# Patient Record
Sex: Female | Born: 1995 | Hispanic: Yes | Marital: Single | Smoking: Never smoker
Health system: Southern US, Community
[De-identification: ages and names within clinical notes are randomized; demographics above are authoritative.]

## PROBLEM LIST (undated history)

## (undated) DIAGNOSIS — F329 Major depressive disorder, single episode, unspecified: Secondary | ICD-10-CM

## (undated) DIAGNOSIS — F32A Depression, unspecified: Secondary | ICD-10-CM

## (undated) DIAGNOSIS — G8929 Other chronic pain: Secondary | ICD-10-CM

## (undated) DIAGNOSIS — N12 Tubulo-interstitial nephritis, not specified as acute or chronic: Secondary | ICD-10-CM

## (undated) DIAGNOSIS — N390 Urinary tract infection, site not specified: Secondary | ICD-10-CM

## (undated) DIAGNOSIS — M545 Low back pain, unspecified: Secondary | ICD-10-CM

## (undated) DIAGNOSIS — R51 Headache: Secondary | ICD-10-CM

## (undated) DIAGNOSIS — R519 Headache, unspecified: Secondary | ICD-10-CM

## (undated) HISTORY — PX: NO PAST SURGERIES: SHX2092

---

## 2016-06-19 ENCOUNTER — Emergency Department (HOSPITAL_COMMUNITY): Payer: Self-pay

## 2016-06-19 ENCOUNTER — Inpatient Hospital Stay (HOSPITAL_COMMUNITY)
Admission: EM | Admit: 2016-06-19 | Discharge: 2016-06-22 | DRG: 690 | Disposition: A | Discharge status: Home / Self Care | Payer: Self-pay | Attending: Family Medicine | Admitting: Family Medicine

## 2016-06-19 ENCOUNTER — Encounter (HOSPITAL_COMMUNITY): Payer: Self-pay | Admitting: Emergency Medicine

## 2016-06-19 DIAGNOSIS — N10 Acute tubulo-interstitial nephritis: Principal | ICD-10-CM | POA: Diagnosis present

## 2016-06-19 DIAGNOSIS — N12 Tubulo-interstitial nephritis, not specified as acute or chronic: Secondary | ICD-10-CM

## 2016-06-19 DIAGNOSIS — R059 Cough, unspecified: Secondary | ICD-10-CM

## 2016-06-19 DIAGNOSIS — B373 Candidiasis of vulva and vagina: Secondary | ICD-10-CM | POA: Diagnosis present

## 2016-06-19 DIAGNOSIS — E876 Hypokalemia: Secondary | ICD-10-CM

## 2016-06-19 DIAGNOSIS — R000 Tachycardia, unspecified: Secondary | ICD-10-CM | POA: Diagnosis present

## 2016-06-19 DIAGNOSIS — B3731 Acute candidiasis of vulva and vagina: Secondary | ICD-10-CM

## 2016-06-19 DIAGNOSIS — R05 Cough: Secondary | ICD-10-CM

## 2016-06-19 DIAGNOSIS — R509 Fever, unspecified: Secondary | ICD-10-CM

## 2016-06-19 DIAGNOSIS — Z87440 Personal history of urinary (tract) infections: Secondary | ICD-10-CM

## 2016-06-19 HISTORY — DX: Other chronic pain: G89.29

## 2016-06-19 HISTORY — DX: Headache: R51

## 2016-06-19 HISTORY — DX: Headache, unspecified: R51.9

## 2016-06-19 HISTORY — DX: Urinary tract infection, site not specified: N39.0

## 2016-06-19 HISTORY — DX: Tubulo-interstitial nephritis, not specified as acute or chronic: N12

## 2016-06-19 HISTORY — DX: Low back pain: M54.5

## 2016-06-19 HISTORY — DX: Low back pain, unspecified: M54.50

## 2016-06-19 HISTORY — DX: Depression, unspecified: F32.A

## 2016-06-19 HISTORY — DX: Major depressive disorder, single episode, unspecified: F32.9

## 2016-06-19 LAB — LACTIC ACID, PLASMA
LACTIC ACID, VENOUS: 0.6 mmol/L (ref 0.5–1.9)
LACTIC ACID, VENOUS: 1.4 mmol/L (ref 0.5–1.9)

## 2016-06-19 LAB — URINALYSIS, ROUTINE W REFLEX MICROSCOPIC
BILIRUBIN URINE: NEGATIVE
Glucose, UA: NEGATIVE mg/dL
KETONES UR: 80 mg/dL — AB
Nitrite: POSITIVE — AB
PROTEIN: 30 mg/dL — AB
Specific Gravity, Urine: 1.02 (ref 1.005–1.030)
pH: 7 (ref 5.0–8.0)

## 2016-06-19 LAB — COMPREHENSIVE METABOLIC PANEL
ALBUMIN: 3.5 g/dL (ref 3.5–5.0)
ALK PHOS: 108 U/L (ref 38–126)
ALT: 27 U/L (ref 14–54)
ANION GAP: 10 (ref 5–15)
AST: 29 U/L (ref 15–41)
BUN: 5 mg/dL — ABNORMAL LOW (ref 6–20)
CO2: 22 mmol/L (ref 22–32)
Calcium: 9.2 mg/dL (ref 8.9–10.3)
Chloride: 103 mmol/L (ref 101–111)
Creatinine, Ser: 0.76 mg/dL (ref 0.44–1.00)
GFR calc non Af Amer: >60 mL/min (ref >60)
GLUCOSE: 107 mg/dL — AB (ref 65–99)
Potassium: 3.3 mmol/L — ABNORMAL LOW (ref 3.5–5.1)
SODIUM: 135 mmol/L (ref 135–145)
Total Bilirubin: 1.4 mg/dL — ABNORMAL HIGH (ref 0.3–1.2)
Total Protein: 7.5 g/dL (ref 6.5–8.1)

## 2016-06-19 LAB — CBC WITH DIFFERENTIAL/PLATELET
BASOS PCT: 0 %
Basophils Absolute: 0 10*3/uL (ref 0.0–0.1)
EOS ABS: 0 10*3/uL (ref 0.0–0.7)
EOS PCT: 0 %
HCT: 41.5 % (ref 36.0–46.0)
HEMOGLOBIN: 13.8 g/dL (ref 12.0–15.0)
LYMPHS ABS: 1.6 10*3/uL (ref 0.7–4.0)
Lymphocytes Relative: 8 %
MCH: 30.3 pg (ref 26.0–34.0)
MCHC: 33.3 g/dL (ref 30.0–36.0)
MCV: 91 fL (ref 78.0–100.0)
Monocytes Absolute: 1.5 10*3/uL — ABNORMAL HIGH (ref 0.1–1.0)
Monocytes Relative: 8 %
NEUTROS PCT: 84 %
Neutro Abs: 16 10*3/uL — ABNORMAL HIGH (ref 1.7–7.7)
PLATELETS: 274 10*3/uL (ref 150–400)
RBC: 4.56 MIL/uL (ref 3.87–5.11)
RDW: 13 % (ref 11.5–15.5)
WBC: 19.1 10*3/uL — AB (ref 4.0–10.5)

## 2016-06-19 LAB — PREGNANCY, URINE: Preg Test, Ur: NEGATIVE

## 2016-06-19 LAB — LIPASE, BLOOD: Lipase: 21 U/L (ref 11–51)

## 2016-06-19 LAB — WET PREP, GENITAL
CLUE CELLS WET PREP: NONE SEEN
SPERM: NONE SEEN
TRICH WET PREP: NONE SEEN

## 2016-06-19 MED ORDER — MORPHINE SULFATE (PF) 4 MG/ML IV SOLN
4.0000 mg | Freq: Once | INTRAVENOUS | Status: AC
  Administered 2016-06-19: 4 mg via INTRAVENOUS
  Filled 2016-06-19: qty 1

## 2016-06-19 MED ORDER — CEFTRIAXONE SODIUM 1 G IJ SOLR
1.0000 g | Freq: Once | INTRAMUSCULAR | Status: AC
  Administered 2016-06-19: 1 g via INTRAVENOUS
  Filled 2016-06-19: qty 10

## 2016-06-19 MED ORDER — FLUCONAZOLE 100 MG PO TABS
150.0000 mg | ORAL_TABLET | Freq: Once | ORAL | Status: AC
  Administered 2016-06-19: 150 mg via ORAL
  Filled 2016-06-19: qty 2

## 2016-06-19 MED ORDER — SODIUM CHLORIDE 0.9 % IV BOLUS (SEPSIS)
1000.0000 mL | Freq: Once | INTRAVENOUS | Status: AC
  Administered 2016-06-19: 1000 mL via INTRAVENOUS

## 2016-06-19 MED ORDER — ACETAMINOPHEN 325 MG PO TABS
650.0000 mg | ORAL_TABLET | Freq: Four times a day (QID) | ORAL | Status: DC | PRN
  Administered 2016-06-19 – 2016-06-22 (×4): 650 mg via ORAL
  Filled 2016-06-19 (×4): qty 2

## 2016-06-19 MED ORDER — ACETAMINOPHEN 325 MG PO TABS
650.0000 mg | ORAL_TABLET | Freq: Once | ORAL | Status: AC
  Administered 2016-06-19: 650 mg via ORAL
  Filled 2016-06-19: qty 2

## 2016-06-19 MED ORDER — SODIUM CHLORIDE 0.9 % IV SOLN
INTRAVENOUS | Status: DC
  Administered 2016-06-19 – 2016-06-22 (×7): via INTRAVENOUS

## 2016-06-19 MED ORDER — ONDANSETRON HCL 4 MG PO TABS
4.0000 mg | ORAL_TABLET | Freq: Four times a day (QID) | ORAL | Status: DC | PRN
Start: 2016-06-19 — End: 2016-06-22
  Administered 2016-06-20: 4 mg via ORAL
  Filled 2016-06-19: qty 1

## 2016-06-19 MED ORDER — HYDROMORPHONE HCL 2 MG/ML IJ SOLN
1.0000 mg | INTRAMUSCULAR | Status: DC | PRN
  Administered 2016-06-19 – 2016-06-20 (×2): 1 mg via INTRAVENOUS
  Filled 2016-06-19 (×2): qty 1

## 2016-06-19 MED ORDER — DEXTROSE 5 % IV SOLN
1.0000 g | INTRAVENOUS | Status: DC
  Administered 2016-06-20: 1 g via INTRAVENOUS
  Filled 2016-06-19: qty 10

## 2016-06-19 MED ORDER — HYDROMORPHONE HCL 2 MG/ML IJ SOLN
1.0000 mg | Freq: Once | INTRAMUSCULAR | Status: AC
  Administered 2016-06-19: 1 mg via INTRAVENOUS
  Filled 2016-06-19: qty 1

## 2016-06-19 MED ORDER — ONDANSETRON HCL 4 MG/2ML IJ SOLN
4.0000 mg | Freq: Once | INTRAMUSCULAR | Status: AC
  Administered 2016-06-19: 4 mg via INTRAVENOUS
  Filled 2016-06-19: qty 2

## 2016-06-19 MED ORDER — ONDANSETRON HCL 4 MG/2ML IJ SOLN
4.0000 mg | Freq: Four times a day (QID) | INTRAMUSCULAR | Status: DC | PRN
  Administered 2016-06-19: 4 mg via INTRAVENOUS
  Filled 2016-06-19: qty 2

## 2016-06-19 MED ORDER — INFLUENZA VAC SPLIT QUAD 0.5 ML IM SUSY
0.5000 mL | PREFILLED_SYRINGE | INTRAMUSCULAR | Status: AC
  Administered 2016-06-20: 0.5 mL via INTRAMUSCULAR
  Filled 2016-06-19: qty 0.5

## 2016-06-19 MED ORDER — IBUPROFEN 800 MG PO TABS
800.0000 mg | ORAL_TABLET | Freq: Once | ORAL | Status: AC
  Administered 2016-06-19: 800 mg via ORAL
  Filled 2016-06-19: qty 1

## 2016-06-19 NOTE — Progress Notes (Signed)
I saw and examined this patient.  Discussed with residents.  I will co sign H&PE when available.  21 yo hispanic female with fever and right sided flank and abd pain.  Labs thus far are consistent with pyelo, the most likely Dx.  PID is also in the differential.  She has lowish BP, tachycardia and high WBC so we are also evaluating for sepsis.  By my assessment she is cardiovascularly stable at present.

## 2016-06-19 NOTE — ED Notes (Signed)
Placed pelvic cart at beside

## 2016-06-19 NOTE — Progress Notes (Signed)
[facility] Admission History and Physical Service Pager: [phone]  Patient name: Laura Ball Medical record number: [id number] Date of birth: 03-24-1996 Age: 21 y.o. Gender: female  Primary Care Provider: No PCP Per Patient Consultants: None Code Status: Full   Chief Complaint: Abdominal and back pain   [employer] interpretor with ID # [id number] was used for this encounter Assessment and Plan: Laura Ball is a 21 y.o. female with no significant past medical history who presents with bilaterally back pain and diffuse abdominal pain concerning for pyelonephritis.  #Pyelonephritis, acute in the setting of recurrent UTI Patient presents with dysuria for the past five days with CVA tenderness and abdominal pain. On admission, patient was febrile to 100.5, tachycardic and had a leukocytosis to19.1 UA was positive with nitrite and rare bacteria. BP was normal on admission 116/62 gradually dropped to 92/56. Although, patient met sepsis criteria (3/4 on SIRS with pyelo as likely source and qSOFA of 1), she was clinically stable to need high level of care. Patient symptoms and physical exam are consistent with pyelonephritis in the setting of a UTI. Pelvic exam in the ED (by ED provider) without adnexal or cervical motion tenderness which makes PID unlikely but can't rule out this given recent unprotected sex. Patient could also have torsion but very unlikely given lab finding and physical exam. Diffuse/suprapubic pain most likely secondary to retroperitoneal pain from pyelo. RUQ [place] was not concerning for cholelithiasis or liver pathology. CMP normal as well. Low suspicion with acute abdomen without rebound tenderness. Lipase normal to think of pancreatitis.  --Admit to FMTS, admitting physician Dr. Andria Frames --Continue Ceftriaxone 1g.  s/p one dose in the ED. --1L bolus NS. S/p 2L in ED --Start patient on NS 125 cc/hr --Follow up on urine cultures  --Follow up on blood  cultures-obtained about 6 hrs after CTX.  --Follow up on lactic acid --Dilaudid 1 mg q3 prn --Tylenol 650 mg q6 prn --Zofran 4 mg q6 prn --Monitor vitals signs --Follow up on GC/Ch, HIV --Follow up on am CBC and BMP. 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  #Vaginal candidiasis Patient with month long vaginal itchiness. Wet prep showed yeast infection consistent  with symptoms. --Will start patient on Fluconazole 150 mg once  --Will continue to monitor  #Hypokalemia, mild Most likely secondary to emesis this morning --will Replete as needed  --Follow up on am BMP  FEN/GI:  --Regular diet, NS 125 cc/hr  Prophylaxis:  --SCDs, patient is ambulating  Disposition: Home pending clinical improvement  History of Present Illness:  Laura Ball is a 21 y.o. female with no significant past medical history who presents today with back and abdominal pain, headache and fever. Patient reports that symptoms started about 5 days ago when she started to have burning with urination. Patient also noticed vaginal discharge for the past 5 days. Patient endorses suprapubic pain radiating bilaterally to her back for the past 3 days. Patient endorses fever for the past 24 hr accompanied with some nausea and vomiting this morning. Patient also complain of headaches. Patient has been also experiencing vaginal itchiness for the past month. She denies taking any medications for pain, fever, dysuria and itchiness. Patient denies shortness of breath, chest pain and diarrhea.  Of note patient reports having three similar episodes in the past year while she was living in [place]. Patient is sexually active with her boyfriend and uses condom intermittently.  In the ED, patient  was tachycardic and febrile with a BP of 92/58. Patient had a leukocytosis to 19 and K was 3.3. UA shows positive nitrite and rare bacteria with some ketones and mod Hbg. Pelvic exam  (by ED provider) was consistent with vaginal discharge without any evidence of adnexal tenderness. RUQ [place] was normal. Patient received, Morphine x1 Dilaudid x2, and was started on Ceftriaxone. Patient had 2L NS bolus. Family medicine was called to admit for pain control and continued IV antibiotics in the setting of pyelonephritis.    Review Of Systems: All other systems negative except per HPI  ROS  Patient Active Problem List   Diagnosis Date Noted  . Pyelonephritis 06/19/2016    Past Medical History: History reviewed. No pertinent past medical history.  Past Surgical History: History reviewed. No pertinent surgical history.  Social History: Social History  Substance Use Topics  . Smoking status: Never Smoker  . Smokeless tobacco: Not on file  . Alcohol use No   Additional social history:  Please also refer to relevant sections of EMR.  Family History: No family history on file. (If not completed, MUST add something in)  Allergies and Medications: No Known Allergies No current facility-administered medications on file prior to encounter.    No current outpatient prescriptions on file prior to encounter.    Objective: BP 92/58   Pulse (!) 130   Temp 100.5 F (38.1 C) (Oral)   Resp 20   Wt 145 lb (65.8 kg)   LMP  (LMP Unknown)   SpO2 92%   Exam: General: Patient appears uncomfortable but in NAD, able to participate in exam. HEENT: Normocephalic atraumatic,PEERLA, moist mucus membrane. Cardiac: RRR, normal heart sounds, no murmurs. 2+ radial and PT pulses bilaterally Respiratory: CTAB, normal effort, No wheezes, rales or rhonchi, CVA tenderness bilaterally Abdomen: soft,  Diffusely tender, no murphy sign,  nondistended, no hepatic or splenomegaly, +BS, no guarding or rebound Extremities: no edema or cyanosis. WWP. Skin: warm and dry, no rashes noted Neuro: alert and oriented x4, no focal deficits. Psych: Normal affect and mood  Labs and Imaging: CBC BMET    Recent Labs Lab 06/19/16 0828  WBC 19.1*  HGB 13.8  HCT 41.5  PLT 274    Recent Labs Lab 06/19/16 0828  NA 135  K 3.3*  CL 103  CO2 22  BUN 5*  CREATININE 0.76  GLUCOSE 107*  CALCIUM 9.2    Wet prep: yeast, WBCs, no trich, no clue cells  US Abdomen Limited  Result Date: 06/19/2016 CLINICAL DATA:  Right upper quadrant pain EXAM: US ABDOMEN LIMITED - RIGHT UPPER QUADRANT COMPARISON:  None. FINDINGS: Gallbladder: No gallstones or wall thickening visualized. No sonographic Murphy sign noted by sonographer. Common bile duct: Diameter: 2.6 Liver: No focal lesion identified. Within normal limits in parenchymal echogenicity. IMPRESSION: 1. Normal exam. Electronically Signed   By: Kerby Moors M.D.   On: 06/19/2016 09:25    Marjie Skiff, MD 06/19/2016, 3:16 PM PGY-1, [place] Intern pager: [phone], text pages welcome

## 2016-06-19 NOTE — ED Notes (Signed)
Gave patient some orange juice patient resting

## 2016-06-19 NOTE — H&P (Signed)
[facility] Admission History and Physical Service Pager: [phone]  Patient name: Laura Ball Medical record number: [id number] Date of birth: 01/27/1996 Age: 21 y.o. Gender: female  Primary Care Provider: No PCP Per Patient Consultants: None Code Status: Full   Chief Complaint: Abdominal and back pain   [employer] interpretor with ID # [id number] was used for this encounter Assessment and Plan: Laura Ball is a 21 y.o. female with no significant past medical history who presents with bilaterally back pain and diffuse abdominal pain concerning for pyelonephritis.  #Pyelonephritis, acute in the setting of recurrent UTI Patient presents with dysuria for the past five days with CVA tenderness and abdominal pain. On admission, patient was febrile to 100.5, tachycardic and had a leukocytosis to19.1 UA was positive with nitrite and rare bacteria. BP was normal on admission 116/62 gradually dropped to 92/56. Although, patient met sepsis criteria (3/4 on SIRS with pyelo as likely source and qSOFA of 1), she was clinically stable to need high level of care. Patient symptoms and physical exam are consistent with pyelonephritis in the setting of a UTI. Pelvic exam in the ED (by ED provider) without adnexal or cervical motion tenderness which makes PID unlikely but can't rule out this given recent unprotected sex. Patient could also have torsion but very unlikely given lab finding and physical exam. Diffuse/suprapubic pain most likely secondary to retroperitoneal pain from pyelo. RUQ [place] was not concerning for cholelithiasis or liver pathology. CMP normal as well. Low suspicion with acute abdomen without rebound tenderness. Lipase normal to think of pancreatitis.  --Admit to FMTS, admitting physician Dr. Andria Frames --Continue Ceftriaxone 1g.  s/p one dose in the ED. --1L bolus NS. S/p 2L in ED --Start patient on NS 125 cc/hr --Follow up on urine cultures  --Follow up on blood  cultures-obtained about 6 hrs after CTX.  --Follow up on lactic acid --Dilaudid 1 mg q3 prn --Tylenol 650 mg q6 prn --Zofran 4 mg q6 prn --Monitor vitals signs --Follow up on GC/Ch, HIV --Follow up on am CBC and BMP. 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  #Vaginal candidiasis Patient with month long vaginal itchiness. Wet prep showed yeast infection consistent  with symptoms. --Will start patient on Fluconazole 150 mg once  --Will continue to monitor  #Hypokalemia, mild Most likely secondary to emesis this morning --will Replete as needed  --Follow up on am BMP  FEN/GI:  --Regular diet, NS 125 cc/hr  Prophylaxis:  --SCDs, patient is ambulating  Disposition: Home pending clinical improvement  History of Present Illness:  Laura Ball is a 21 y.o. female with no significant past medical history who presents today with back and abdominal pain, headache and fever. Patient reports that symptoms started about 5 days ago when she started to have burning with urination. Patient also noticed vaginal discharge for the past 5 days. Patient endorses suprapubic pain radiating bilaterally to her back for the past 3 days. Patient endorses fever for the past 24 hr accompanied with some nausea and vomiting this morning. Patient also complain of headaches. Patient has been also experiencing vaginal itchiness for the past month. She denies taking any medications for pain, fever, dysuria and itchiness. Patient denies shortness of breath, chest pain and diarrhea.  Of note patient reports having three similar episodes in the past year while she was living in [place]. Patient is sexually active with her boyfriend and uses condom intermittently.  In the ED, patient  was tachycardic and febrile with a BP of 92/58. Patient had a leukocytosis to 19 and K was 3.3. UA shows positive nitrite and rare bacteria with some ketones and mod Hbg.  Pelvic exam (by ED provider) was consistent with vaginal discharge without any evidence of adnexal tenderness. RUQ [place] was normal. Patient received, Morphine x1 Dilaudid x2, and was started on Ceftriaxone. Patient had 2L NS bolus. Family medicine was called to admit for pain control and continued IV antibiotics in the setting of pyelonephritis.    Review Of Systems:  ROS All other systems negative except per HPI  Patient Active Problem List   Diagnosis Date Noted  . Pyelonephritis 06/19/2016    Past Medical History: History of recurrent UTI  Past Surgical History: History reviewed. No pertinent surgical history.  Social History: Social History  Substance Use Topics  . Smoking status: Never Smoker  . Smokeless tobacco: Not on file  . Alcohol use No   Additional social history: Denies recreational drug use. Sexually active with one female partner. Intermittent condom use Please also refer to relevant sections of EMR.  Family History: No family history on file. (If not completed, MUST add something in)  Allergies and Medications: No Known Allergies No current facility-administered medications on file prior to encounter.    No current outpatient prescriptions on file prior to encounter.    Objective: BP (!) 97/42 (BP Location: Left Arm)   Pulse 105   Temp 98.4 F (36.9 C) (Oral)   Resp 16   Wt 145 lb (65.8 kg)   LMP  (LMP Unknown)   SpO2 96%  Exam: Exam: General: Patient appears uncomfortable but in NAD, able to participate in exam. HEENT: Normocephalic atraumatic,PEERLA, moist mucus membrane. Cardiac: RRR, normal heart sounds, no murmurs. 2+ radial and PT pulses bilaterally Respiratory: CTAB, normal effort, No wheezes, rales or rhonchi, CVA tenderness bilaterally Abdomen: soft,  Diffusely tender, no murphy sign,  nondistended, no hepatic or splenomegaly, +BS, no guarding or rebound Extremities: no edema or cyanosis. WWP. Skin: warm and dry, no rashes  noted Neuro: alert and oriented x4, no focal deficits. Psych: Normal affect and mood  Labs and Imaging: CBC BMET   Recent Labs Lab 06/19/16 0828  WBC 19.1*  HGB 13.8  HCT 41.5  PLT 274    Recent Labs Lab 06/19/16 0828  NA 135  K 3.3*  CL 103  CO2 22  BUN 5*  CREATININE 0.76  GLUCOSE 107*  CALCIUM 9.2     US Abdomen Limited  Result Date: 06/19/2016 CLINICAL DATA:  Right upper quadrant pain EXAM: US ABDOMEN LIMITED - RIGHT UPPER QUADRANT COMPARISON:  None. FINDINGS: Gallbladder: No gallstones or wall thickening visualized. No sonographic Murphy sign noted by sonographer. Common bile duct: Diameter: 2.6 Liver: No focal lesion identified. Within normal limits in parenchymal echogenicity. IMPRESSION: 1. Normal exam. Electronically Signed   By: Kerby Moors M.D.   On: 06/19/2016 09:25   Marjie Skiff, MD 06/19/2016, 3:16 PM PGY-1, [place] Intern pager: [phone], text pages welcome   I have seen and evaluated the patient with Dr. Andy Gauss. I am in agreement with the note above in its revised form. My additions are in red.  Wendee Beavers, MD, PGY-2 06/19/2016 7:43 PM Mercy Riding, MD 06/19/2016, 7:39 PM PGY-2, [place] Intern pager: [phone], text pages welcome

## 2016-06-19 NOTE — ED Notes (Signed)
Pt tolerating PO challenge; states "she feels the same"

## 2016-06-19 NOTE — ED Notes (Signed)
Warm pack applied to IV site

## 2016-06-19 NOTE — ED Triage Notes (Signed)
C/o pain ruq radiating to back since yesterday.  Also chills then hot.  Nausea x 24 hrs, vomited x 1 today.

## 2016-06-19 NOTE — ED Provider Notes (Signed)
[facility]-EMERGENCY DEPT Provider Note   CSN: [id number] Arrival date & time: 06/19/16  0751     History   Chief Complaint Chief Complaint  Patient presents with  . Abdominal Pain  . Emesis    HPI Laura Ball is a 21 y.o. female.  Patient presents to the emergency department with chief complaint of abdominal pain. She complains the pain being in the right upper abdomen. She reports associated nausea and vomiting. She states that the pain radiates to her back. She reports having had the symptoms in the past, but states that the symptoms worsened 3 days ago. She reports associated fever, but denies any chills. Additionally, she reports having dysuria and vaginal discharge. She has not taken anything for her symptoms. There are no other associated symptoms. The abdominal pain is worsened with palpation. There are no other modifying factors.   The history is provided by the patient. The history is limited by a language barrier. A language interpreter was used ([place]).    History reviewed. No pertinent past medical history.  There are no active problems to display for this patient.   History reviewed. No pertinent surgical history.  OB History    No data available       Home Medications    Prior to Admission medications   Not on File    Family History No family history on file.  Social History Social History  Substance Use Topics  . Smoking status: Never Smoker  . Smokeless tobacco: Not on file  . Alcohol use No     Allergies   Patient has no known allergies.   Review of Systems Review of Systems  Gastrointestinal: Positive for abdominal pain, nausea and vomiting.  Genitourinary: Positive for dysuria and vaginal discharge.  All other systems reviewed and are negative.    Physical Exam Updated Vital Signs BP 122/76 (BP Location: Right Arm)   Pulse 120   Temp 99.8 F (37.7 C) (Oral)   Resp 22   Wt 65.8 kg   LMP  (LMP Unknown)   SpO2 99%    Physical Exam  Constitutional: She is oriented to person, place, and time. She appears well-developed and well-nourished.  HENT:  Head: Normocephalic and atraumatic.  Eyes: Conjunctivae and EOM are normal. Pupils are equal, round, and reactive to light.  Neck: Normal range of motion. Neck supple.  Cardiovascular: Normal rate and regular rhythm.  Exam reveals no gallop and no friction rub.   No murmur heard. Pulmonary/Chest: Effort normal and breath sounds normal. No respiratory distress. She has no wheezes. She has no rales. She exhibits no tenderness.  Abdominal: Soft. Bowel sounds are normal. She exhibits no distension and no mass. There is tenderness. There is no rebound and no guarding.  Right upper quadrant abdominal tenderness palpation  Genitourinary:  Genitourinary Comments: Pelvic exam chaperoned by female ER tech, no right or left adnexal tenderness, moderate uterine tenderness, mild vaginal discharge, no bleeding, no CMT or friability, no foreign body, no injury to the external genitalia, no other significant findings   Musculoskeletal: Normal range of motion. She exhibits no edema or tenderness.  Neurological: She is alert and oriented to person, place, and time.  Skin: Skin is warm and dry.  Psychiatric: She has a normal mood and affect. Her behavior is normal. Judgment and thought content normal.  Nursing note and vitals reviewed.    ED Treatments / Results  Labs (all labs ordered are listed, but only abnormal results are displayed) Labs Reviewed  WET PREP, GENITAL  URINALYSIS, ROUTINE W REFLEX MICROSCOPIC  PREGNANCY, URINE  CBC WITH DIFFERENTIAL/PLATELET  COMPREHENSIVE METABOLIC PANEL  LIPASE, BLOOD  GC/CHLAMYDIA PROBE AMP () NOT AT [facility]    EKG  EKG Interpretation None       Radiology No results found.  Procedures Procedures (including critical care time)  Medications Ordered in ED Medications - No data to display   Initial Impression  / Assessment and Plan / ED Course  I have reviewed the triage vital signs and the nursing notes.  Pertinent labs & imaging results that were available during my care of the patient were reviewed by me and considered in my medical decision making (see chart for details).  Clinical Course     Patient with right upper quadrant abdominal pain. She is noted to be tachycardic and mildly febrile. Will give fluids, and pain medicine. Will check LFTs, patient still has her gallbladder. We'll also check urinalysis as patient has had some dysuria. Additionally, patient complains of vaginal discharge. Lynnae January syndrome is considered.  LFTs are normal, right upper quadrant ultrasound is normal. Urinalysis is insistent with UTI. In the setting of UTI with fever and vomiting and right flank pain, will diagnosed with pyelonephritis. Will give fluids, treat pain and nausea, and reassess. Rocephin being given.  12:21 PM Still having pain.  Will give 1mg  dilaudid and reassess.  Tolerating POs.  Plan for discharge pending improvement.  Patient still mildly febrile and tachycardic, still has severe pain.  Appreciate family medicine service for admitting the patient. She has received 3L in the ED and rocephin.     Final Clinical Impressions(s) / ED Diagnoses   Final diagnoses:  Pyelonephritis    New Prescriptions New Prescriptions   No medications on file     Roxy Horseman, PA-C 06/19/16 1507    Jerelyn Scott, MD 06/19/16 1510

## 2016-06-20 ENCOUNTER — Observation Stay (HOSPITAL_COMMUNITY): Payer: Self-pay

## 2016-06-20 DIAGNOSIS — R509 Fever, unspecified: Secondary | ICD-10-CM

## 2016-06-20 DIAGNOSIS — R059 Cough, unspecified: Secondary | ICD-10-CM

## 2016-06-20 DIAGNOSIS — R05 Cough: Secondary | ICD-10-CM

## 2016-06-20 LAB — BASIC METABOLIC PANEL
ANION GAP: 5 (ref 5–15)
BUN: 6 mg/dL (ref 6–20)
CALCIUM: 8.3 mg/dL — AB (ref 8.9–10.3)
CO2: 24 mmol/L (ref 22–32)
Chloride: 109 mmol/L (ref 101–111)
Creatinine, Ser: 0.53 mg/dL (ref 0.44–1.00)
Glucose, Bld: 87 mg/dL (ref 65–99)
POTASSIUM: 3.8 mmol/L (ref 3.5–5.1)
SODIUM: 138 mmol/L (ref 135–145)

## 2016-06-20 LAB — CBC
HCT: 34.9 % — ABNORMAL LOW (ref 36.0–46.0)
Hemoglobin: 11.1 g/dL — ABNORMAL LOW (ref 12.0–15.0)
MCH: 29.8 pg (ref 26.0–34.0)
MCHC: 31.8 g/dL (ref 30.0–36.0)
MCV: 93.6 fL (ref 78.0–100.0)
PLATELETS: 196 10*3/uL (ref 150–400)
RBC: 3.73 MIL/uL — AB (ref 3.87–5.11)
RDW: 13.7 % (ref 11.5–15.5)
WBC: 12.1 10*3/uL — AB (ref 4.0–10.5)

## 2016-06-20 LAB — HIV ANTIBODY (ROUTINE TESTING W REFLEX): HIV SCREEN 4TH GENERATION: NONREACTIVE

## 2016-06-20 LAB — LACTIC ACID, PLASMA: Lactic Acid, Venous: 0.7 mmol/L (ref 0.5–1.9)

## 2016-06-20 LAB — CORTISOL: CORTISOL PLASMA: 6.2 ug/dL

## 2016-06-20 MED ORDER — SODIUM CHLORIDE 0.9 % IV BOLUS (SEPSIS)
1000.0000 mL | Freq: Once | INTRAVENOUS | Status: AC
  Administered 2016-06-20: 1000 mL via INTRAVENOUS

## 2016-06-20 MED ORDER — PIPERACILLIN-TAZOBACTAM 3.375 G IVPB 30 MIN
3.3750 g | Freq: Once | INTRAVENOUS | Status: AC
  Administered 2016-06-20: 3.375 g via INTRAVENOUS
  Filled 2016-06-20: qty 50

## 2016-06-20 MED ORDER — IOPAMIDOL (ISOVUE-300) INJECTION 61%
INTRAVENOUS | Status: AC
  Administered 2016-06-20: 100 mL
  Filled 2016-06-20: qty 100

## 2016-06-20 MED ORDER — PIPERACILLIN-TAZOBACTAM 3.375 G IVPB
3.3750 g | Freq: Three times a day (TID) | INTRAVENOUS | Status: DC
  Administered 2016-06-21 – 2016-06-22 (×4): 3.375 g via INTRAVENOUS
  Filled 2016-06-20 (×5): qty 50

## 2016-06-20 MED ORDER — SODIUM CHLORIDE 0.9 % IV BOLUS (SEPSIS)
500.0000 mL | Freq: Once | INTRAVENOUS | Status: AC
  Administered 2016-06-20: 500 mL via INTRAVENOUS

## 2016-06-20 MED ORDER — HYDROMORPHONE HCL 2 MG/ML IJ SOLN
1.0000 mg | INTRAMUSCULAR | Status: DC | PRN
  Administered 2016-06-20 – 2016-06-21 (×3): 1 mg via INTRAVENOUS
  Filled 2016-06-20 (×3): qty 1

## 2016-06-20 NOTE — Progress Notes (Signed)
Paged attending provider. Provider called back and confirmed the he saw the pt blood pressure of 89/56 (67) no new orders at this time,.

## 2016-06-20 NOTE — Care Management Note (Addendum)
Case Management Note  Patient Details  Name: Laura Ball MRN: [id number] Date of Birth: 04/07/1996  Subjective/Objective:          Admitted with pyelonephritis, non english speaking pt. [place] is [place] and speaks Spanish. Dad @ bedside speaks some english states [place] has been in the [place]. Pt  resides with uncle, Laura Ball. Independent with ADL's ,and no DME PTA. Pt without health insurance. [facility]   PCP: pt to f/u with [facility]   Lubertha SouthOmar Ball [facility](Uncle)     [phone]       Action/Plan: Return to home when medically stable. CM to f/u with disposition needs.  Expected Discharge Date:                  Expected Discharge Plan:  Home/Self Care  In-House Referral:     Discharge planning Services  CM Consult  Post Acute Care Choice:    Choice offered to:     DME Arranged:    DME Agency:     HH Arranged:    HH Agency:     Status of Service:  Completed, signed off  If discussed at [employer] Length of Stay Meetings, dates discussed:    Additional Comments:  Epifanio LeschesCole, Amitai Delaughter Hudson, RN 06/20/2016, 2:52 PM

## 2016-06-20 NOTE — Progress Notes (Signed)
Notified MD that pt's temp is 103 and HR is in the 120s. Will continue to monitor pt. Nelda MarseilleJenny Thacker, RN

## 2016-06-20 NOTE — Progress Notes (Signed)
Pharmacy Antibiotic Note Laura Ball is a 21 y.o. female admitted on 06/19/2016 with pyelonephritis and RLL pneumonia. Initially started on ceftriaxone but has remained febrile x 36 hours and pharmacy asked broaden to Zosyn.    Plan: 1. Zosyn 3.375g IV q8h (4 hour infusion).  2. Follow up culture data and narrow as feasible   Weight: 145 lb (65.8 kg)  Temp (24hrs), Avg:98.8 F (37.1 C), Min:97.3 F (36.3 C), Max:103 F (39.4 C)   Recent Labs Lab 06/19/16 0828 06/19/16 1731 06/19/16 1957 06/20/16 0600  WBC 19.1*  --   --  12.1*  CREATININE 0.76  --   --  0.53  LATICACIDVEN  --  0.6 1.4  --     CrCl cannot be calculated (Unknown ideal weight.).    No Known Allergies  Antimicrobials this admission:  1/17 Zosyn >>  1/16 Ceftriaxone x 2 doses   Dose adjustments this admission: n/a  Microbiology results: 1/16 BCx: ngtd 1/16 UCx: px   Thank you for allowing pharmacy to be a part of this patient's care.  Pollyann SamplesAndy Eudora Ball, PharmD, BCPS 06/20/2016, 9:31 PM

## 2016-06-20 NOTE — Progress Notes (Signed)
Family Medicine Teaching Service Daily Progress Note Intern Pager: [phone]  Patient name: Laura Ball Medical record number: [id number] Date of birth: 1996/02/06 Age: 21 y.o. Gender: female  Primary Care Provider: No PCP Per Patient Consultants: None Code Status: Full  Pt Overview and Major Events to Date:  1/16: Admitted for abdominal/back pain suspicious for Pyelonephritis.  Assessment and Plan: [name] a 21 y.o.femalewith no significant past medical history whopresentswith bilaterally back pain and diffuse abdominal pain concerning for pyelonephritis.  #Pyelonephritis, acute in the setting of recurrent UTI: dysuria x5 days/CVA tenderness c/w pyelonephritis. UA positive nitrite and bacteria. Initially met sepsis criteria (3/4 on SIRS with pyelo as likely source and qSOFA of 1) patient was clinically stable and has not needed high level of care. Lactic acid was unremarkable. Notable clinical improvement since admission. Blood pressure has been persistently low, which may be her baseline. Heart rate was minimally tachycardic this morning. Patient is afebrile. Due to initial presenting symptoms as well as her history of recurrent UTIs I believe it is important to rule out renal abscess.  --Continue Ceftriaxone (1/16>>) --additional 1L bolus provided this AM --IVF NS 125 cc/hr --Follow up on urine cultures  --Follow up on blood cultures (obtained about 6 hrs after CTX) --Dilaudid 1 mg q3 prn --Tylenol 650 mg q6 prn --Zofran 4 mg q6 prn --Monitor vitals signs --Follow up on GC/Chla --HIV: neg --CT abdom/pelvis to assess for Renal Abscess vs appendix vs typical pyelo. --H/o recurrent UTIs; x4in the past year >> consider OP prophylaxis coverage vs cystogram (not native to [place], unsure if recurrent UTIs as child)  #Vaginal candidiasis Patient with month long vaginal itchiness. Wet prep showed yeast infection consistent with symptoms. --Will start patient on  Fluconazole 150 mg once  --Will continue to monitor  #Hypokalemia, mild; resolved. Most likely secondary to emesis this morning --will Replete as needed  --monitor  FEN/GI:  --Regular diet, NS 125 cc/hr  Prophylaxis:  --SCDs, patient is ambulating  Disposition: pending improvement  Subjective:  Patient states that her pain is the same as it was yesterday. Continues to have some waxing/waning chills and feelings of fever. Able to drink, but appetite is not there. Urinating well.   Objective: Temp:  [97.3 F (36.3 C)-101.5 F (38.6 C)] 98.1 F (36.7 C) (01/17 0759) Pulse Rate:  [70-130] 102 (01/17 0759) Resp:  [16-18] 18 (01/17 0759) BP: (87-116)/(39-83) 108/83 (01/17 0759) SpO2:  [91 %-100 %] 98 % (01/17 0759) Weight:  [145 lb (65.8 kg)] 145 lb (65.8 kg) (01/16 1840) Physical Exam: Gen: NAD, alert, cooperative, appears uncomfortable and occasionally shivers after blanket is pulled back HEENT: NCAT, EOMI, PERRL, MMM CV: Tachycardic, RR, no murmur Resp: CTAB, no wheezes, non-labored Abd: soft, mod-severe tenderness at Rt CVA and entire Rt aspect of the abdoment, no murphy sign, nondistended, no hepatic or splenomegaly, +BS, mild-to-no guarding, no rebound Ext: No edema, warm Neuro: Alert and oriented, Speech clear, No gross deficits   Laboratory:  Recent Labs Lab 06/19/16 0828 06/20/16 0600  WBC 19.1* 12.1*  HGB 13.8 11.1*  HCT 41.5 34.9*  PLT 274 196    Recent Labs Lab 06/19/16 0828 06/20/16 0600  NA 135 138  K 3.3* 3.8  CL 103 109  CO2 22 24  BUN 5* 6  CREATININE 0.76 0.53  CALCIUM 9.2 8.3*  PROT 7.5  --   BILITOT 1.4*  --   ALKPHOS 108  --   ALT 27  --   AST 29  --  GLUCOSE 107* 87    Imaging/Diagnostic Tests: RUQ Abdom US 1/16 IMPRESSION: 1. Normal exam.   Elberta Leatherwood, MD 06/20/2016, 10:45 AM PGY-3, [place] Intern pager: [phone], text pages welcome

## 2016-06-21 DIAGNOSIS — R5081 Fever presenting with conditions classified elsewhere: Secondary | ICD-10-CM

## 2016-06-21 DIAGNOSIS — B3731 Acute candidiasis of vulva and vagina: Secondary | ICD-10-CM

## 2016-06-21 DIAGNOSIS — E876 Hypokalemia: Secondary | ICD-10-CM

## 2016-06-21 DIAGNOSIS — B373 Candidiasis of vulva and vagina: Secondary | ICD-10-CM

## 2016-06-21 LAB — CBC
HCT: 32.1 % — ABNORMAL LOW (ref 36.0–46.0)
HEMOGLOBIN: 10.5 g/dL — AB (ref 12.0–15.0)
MCH: 29.9 pg (ref 26.0–34.0)
MCHC: 32.7 g/dL (ref 30.0–36.0)
MCV: 91.5 fL (ref 78.0–100.0)
Platelets: 224 10*3/uL (ref 150–400)
RBC: 3.51 MIL/uL — AB (ref 3.87–5.11)
RDW: 13.6 % (ref 11.5–15.5)
WBC: 11.8 10*3/uL — AB (ref 4.0–10.5)

## 2016-06-21 LAB — GC/CHLAMYDIA PROBE AMP (~~LOC~~) NOT AT ARMC
CHLAMYDIA, DNA PROBE: NEGATIVE
Neisseria Gonorrhea: NEGATIVE

## 2016-06-21 LAB — BASIC METABOLIC PANEL
ANION GAP: 7 (ref 5–15)
BUN: <5 mg/dL — ABNORMAL LOW (ref 6–20)
CALCIUM: 7.9 mg/dL — AB (ref 8.9–10.3)
CHLORIDE: 110 mmol/L (ref 101–111)
CO2: 23 mmol/L (ref 22–32)
Creatinine, Ser: 0.66 mg/dL (ref 0.44–1.00)
GFR calc non Af Amer: >60 mL/min (ref >60)
Glucose, Bld: 104 mg/dL — ABNORMAL HIGH (ref 65–99)
Potassium: 3.4 mmol/L — ABNORMAL LOW (ref 3.5–5.1)
Sodium: 140 mmol/L (ref 135–145)

## 2016-06-21 LAB — LACTIC ACID, PLASMA: Lactic Acid, Venous: 0.6 mmol/L (ref 0.5–1.9)

## 2016-06-21 MED ORDER — POTASSIUM CHLORIDE CRYS ER 20 MEQ PO TBCR
40.0000 meq | EXTENDED_RELEASE_TABLET | Freq: Once | ORAL | Status: AC
  Administered 2016-06-21: 40 meq via ORAL
  Filled 2016-06-21: qty 2

## 2016-06-21 NOTE — Progress Notes (Signed)
Family Medicine Teaching Service Daily Progress Note Intern Pager: [phone]  Patient name: Laura Ball Medical record number: [id number] Date of birth: Aug 14, 1995 Age: 21 y.o. Gender: female  Primary Care Provider: No PCP Per Patient Consultants: None Code Status: Full  Pt Overview and Major Events to Date:  1/16: Admitted for pyelonephritis.  Assessment and Plan: Laura Salmeronis a 21 y.o.femalewith no significant past medical history whopresentswith bilaterally back pain and diffuse abdominal pain concerning for pyelonephritis.  #Pyelonephritis, acute in the setting of recurrent UTI: dysuria and CVA tenderness c/w pyelonephritis. UA positive nitrite and bacteria. Initially met sepsis criteria (3/4 on SIRS with pyelo as likely source and qSOFA of 1). Notable clinical improvement since admission but febrile to Tmax 103.0 overnight. Lactic acid WNL. CT abd neg for renal abscess but does show consolidation in the posterior aspect of R lower lobe with small parapneumonic pleural effusion. GC/Chlamydia neg. HIV neg. Blood pressure has been persistently low, which may be her baseline. VSS stable and afebrile this morning. -Continue Ceftriaxone (1/16>>), zosyn (1/17>>) added give high fever last night  -IVF NS 125 cc/hr  - Urine culture showing E coli, susceptibilities pending. Follow up on blood cultures (obtained about 6 hrs after CTX) currently NG<24hrs. Repeat cultures collected last night.  -Dilaudid 1 mg q4 prn -Tylenol 650 mg q6 prn -Heating pad prn -Zofran 4 mg q6 prn -Monitor fever curve and vital signs -H/o recurrent UTIs; x4in the past year >> consider OP prophylaxis coverage vs cystogram (not native to [place], unsure if had recurrent UTIs as child)  #Vaginal candidiasis Patient with month long vaginal itchiness. Wet prep showed yeast infection consistentwith symptoms. - Continue fluconazole 150 mg once  - Will continue to monitor  #Hypokalemia, mild 2/2 GI losses, K  3.4 today -will Replete as needed  -monitor  FEN/GI:   -Regular diet, NS 125 cc/hr  Prophylaxis:  -SCDs, patient can ambulate   Disposition: pending improvement  Subjective:  Patient states that her pain is mildly improved from yesterday, still on R side of abdomen. Continues to have some waxing/waning chills and feelings of fever, vomited once last night  Able to drink, but appetite is not there. Urinating well. Feels lightheaded when attempts to stand.  Objective: Temp:  [97.5 F (36.4 C)-103 F (39.4 C)] 98.1 F (36.7 C) (01/18 0440) Pulse Rate:  [84-127] 87 (01/18 0440) Resp:  [18] 18 (01/18 0440) BP: (97-110)/(50-83) 97/54 (01/18 0440) SpO2:  [94 %-99 %] 97 % (01/18 0440) Physical Exam: Gen: Laying in bed, appears mild uncomfortable but in no distress HEENT: NCAT, EOMI, PERRL, MMM CV: RRR, normal S1 and S2, no murmur Resp: CTAB, no wheezes, non-labored Abd: soft, moderate tenderness at Rt CVA and entire Rt aspect of the abdomen, no murphy sign, nondistended, no hepatic or splenomegaly, +BS, mild-to-no guarding, no rebound Ext: No edema, warm and well perfused <3 sec cap refill Neuro: Alert and oriented, Speech clear, No gross deficits   Laboratory:  Recent Labs Lab 06/19/16 0828 06/20/16 0600 06/21/16 0117  WBC 19.1* 12.1* 11.8*  HGB 13.8 11.1* 10.5*  HCT 41.5 34.9* 32.1*  PLT 274 196 224    Recent Labs Lab 06/19/16 0828 06/20/16 0600 06/21/16 0117  NA 135 138 140  K 3.3* 3.8 3.4*  CL 103 109 110  CO2 '22 24 23  '$ BUN 5* 6 <5*  CREATININE 0.76 0.53 0.66  CALCIUM 9.2 8.3* 7.9*  PROT 7.5  --   --   BILITOT 1.4*  --   --  ALKPHOS 108  --   --   ALT 27  --   --   AST 29  --   --   GLUCOSE 107* 87 104*    Imaging/Diagnostic Tests: Dg Chest 2 View  Result Date: 06/20/2016 CLINICAL DATA:  Cough, sepsis, UTI EXAM: CHEST  2 VIEW COMPARISON:  None. FINDINGS: Cardiomediastinal silhouette is unremarkable. No infiltrate or pleural effusion. No pulmonary  edema. Mild infrahilar bronchitic changes. Best seen on lateral view. Bony thorax is unremarkable. IMPRESSION: No infiltrate or pleural effusion. No pulmonary edema. Mild infrahilar bronchitic changes. Electronically Signed   By: Lahoma Crocker M.D.   On: 06/20/2016 09:40   Ct Abdomen Pelvis W Contrast  Result Date: 06/20/2016 CLINICAL DATA:  21 year old female with history of recurrent urinary tract infections presenting with 5 day history of dysuria and CVA tenderness compatible with pyelonephritis. Positive urinalysis. Evaluate for potential renal abscess. EXAM: CT ABDOMEN AND PELVIS WITH CONTRAST TECHNIQUE: Multidetector CT imaging of the abdomen and pelvis was performed using the standard protocol following bolus administration of intravenous contrast. CONTRAST:  168m ISOVUE-300 IOPAMIDOL (ISOVUE-300) INJECTION 61% COMPARISON:  No priors. FINDINGS: Lower chest: Airspace consolidation in the posterior aspect of the right lower lobe. Small right pleural effusion lying dependently. Hepatobiliary: No cystic or solid hepatic lesions. No intra or extrahepatic biliary ductal dilatation. Gallbladder is unremarkable in appearance. Pancreas: No pancreatic mass. No pancreatic ductal dilatation. No pancreatic or peripancreatic fluid or inflammatory changes. Spleen: Unremarkable. Adrenals/Urinary Tract: Striated nephrogram in the right kidney compatible with clinically suspected pyelonephritis. No discrete renal abscess is identified. Small amount of perinephric stranding adjacent to the right kidney. Left kidney is normal in appearance except for a 4 mm nonobstructive calculus in the lower pole collecting system of the left kidney. No hydroureteronephrosis. Urinary bladder is normal in appearance. Stomach/Bowel: The appearance of the stomach is normal. There is no pathologic dilatation of small bowel or colon. Normal appendix. Vascular/Lymphatic: No significant atherosclerotic disease, aneurysm or dissection identified in  the abdominal or pelvic vasculature. Numerous prominent but nonenlarged retroperitoneal lymph nodes are noted, presumably reactive. Reproductive: Uterus and ovaries are unremarkable in appearance. Other: Small volume of ascites.  No pneumoperitoneum. Musculoskeletal: There are no aggressive appearing lytic or blastic lesions noted in the visualized portions of the skeleton. IMPRESSION: 1. Findings are compatible with right-sided pyelonephritis as clinically suspected. No evidence of renal abscess. 2. However, there is also consolidation in the posterior aspect of the right lower lobe. If there has been recent vomiting, findings would suggest sequela of aspiration with aspiration pneumonia/pneumonitis. Small right parapneumonic pleural effusion also noted. 3. 4 mm nonobstructive calculus in the lower pole collecting system of left kidney. Electronically Signed   By: DVinnie LangtonM.D.   On: 06/20/2016 11:47    EBufford Lope DO 06/21/2016, 7:27 AM PGY-1, [place] pager: [phone] text pages welcome

## 2016-06-22 LAB — CBC
HCT: 31.1 % — ABNORMAL LOW (ref 36.0–46.0)
HEMOGLOBIN: 10.1 g/dL — AB (ref 12.0–15.0)
MCH: 29.7 pg (ref 26.0–34.0)
MCHC: 32.5 g/dL (ref 30.0–36.0)
MCV: 91.5 fL (ref 78.0–100.0)
Platelets: 286 10*3/uL (ref 150–400)
RBC: 3.4 MIL/uL — AB (ref 3.87–5.11)
RDW: 13.7 % (ref 11.5–15.5)
WBC: 6.7 10*3/uL (ref 4.0–10.5)

## 2016-06-22 LAB — URINE CULTURE
Culture: >=100000 — AB
Culture: NO GROWTH

## 2016-06-22 LAB — BASIC METABOLIC PANEL
Anion gap: 7 (ref 5–15)
CHLORIDE: 107 mmol/L (ref 101–111)
CO2: 26 mmol/L (ref 22–32)
CREATININE: 0.58 mg/dL (ref 0.44–1.00)
Calcium: 8.3 mg/dL — ABNORMAL LOW (ref 8.9–10.3)
GFR calc Af Amer: >60 mL/min (ref >60)
GFR calc non Af Amer: >60 mL/min (ref >60)
Glucose, Bld: 86 mg/dL (ref 65–99)
POTASSIUM: 3.4 mmol/L — AB (ref 3.5–5.1)
SODIUM: 140 mmol/L (ref 135–145)

## 2016-06-22 MED ORDER — CIPROFLOXACIN HCL 500 MG PO TABS
500.0000 mg | ORAL_TABLET | Freq: Two times a day (BID) | ORAL | Status: DC
  Administered 2016-06-22: 500 mg via ORAL
  Filled 2016-06-22: qty 1

## 2016-06-22 MED ORDER — POLYETHYLENE GLYCOL 3350 17 G PO PACK
17.0000 g | PACK | Freq: Every day | ORAL | Status: DC
  Administered 2016-06-22: 17 g via ORAL
  Filled 2016-06-22: qty 1

## 2016-06-22 MED ORDER — CIPROFLOXACIN HCL 500 MG PO TABS: 500.0000 mg | ORAL_TABLET | Freq: Two times a day (BID) | ORAL | 0 refills | Status: AC

## 2016-06-22 MED ORDER — CEPHALEXIN 500 MG PO CAPS
500.0000 mg | ORAL_CAPSULE | Freq: Two times a day (BID) | ORAL | Status: DC
Start: 2016-06-22 — End: 2016-06-22

## 2016-06-22 MED ORDER — POTASSIUM CHLORIDE CRYS ER 20 MEQ PO TBCR
40.0000 meq | EXTENDED_RELEASE_TABLET | Freq: Once | ORAL | Status: AC
  Administered 2016-06-22: 40 meq via ORAL
  Filled 2016-06-22: qty 2

## 2016-06-22 NOTE — Progress Notes (Signed)
Laura Ball to be D/C'd to home per MD order.  Discussed with the patient and all questions fully answered.  VSS, Skin clean, dry and intact without evidence of skin break down, no evidence of skin tears noted. IV catheter discontinued intact. Site without signs and symptoms of complications. Dressing and pressure applied.  An After Visit Summary was printed and given to the patient. Patient received prescription.  D/c education completed with Spanish interpreter and patient/family present. Discussed follow up instructions, medication list, d/c activities limitations if indicated, with other d/c instructions as indicated by MD - patient able to verbalize understanding, all questions fully answered.   Patient instructed to return to ED, call 911, or call MD for any changes in condition.   Patient escorted via WC, and D/C home via private auto.  Joellyn HaffKayla L Price 06/22/2016 2:01 PM

## 2016-06-22 NOTE — Progress Notes (Signed)
Family Medicine Teaching Service Daily Progress Note Intern Pager: [phone]  Patient name: Laura Ball Medical record number: [id number] Date of birth: 08/11/1995 Age: 21 y.o. Gender: female  Primary Care Provider: No PCP Per Patient Consultants: None Code Status: Full  Pt Overview and Major Events to Date:  1/16: Admitted for pyelonephritis. 1/18: transitioned from Zosyn   Assessment and Plan: Laura Ball a 21 y.o.femalewith no significant past medical history whopresentswith bilaterally back pain and diffuse abdominal pain concerning for pyelonephritis.  #Pyelonephritis, acute in the setting of recurrent UTI On admission, dysuria and CVA tenderness c/w pyelonephritis. Notable clinical improvement since admission but febrile to Tmax 103.0 overnight (1/18). Lactic acid WNL. CT abd neg for renal abscess but does show consolidation in the posterior aspect of R lower lobe with small parapneumonic pleural effusion. VSS stable and afebrile this morning. --Discontinued Ceftriaxone 1 g start 1/16- stopped 1/17 --Continue Zosyn 3.375 g, DAY2  --IVF NS 125 cc/hr could decrease if patient has improved po intake --Follow up on urine culture susceptibilities --Follow up on repeat Blood cultures --Dilaudid 1 mg q4 prn --Tylenol 650 mg q6 prn --Heating pad prn --Zofran 4 mg q6 prn -Monitor fever curve and vital signs -H/o recurrent UTIs; x4in the past year >> consider OP prophylaxis coverage vs cystogram ([place] native to [place], unsure if had recurrent UTIs as child)  #Vaginal candidiasis, resolved s/p 1 dose diflucan - Will continue to monitor  #Hypokalemia, mild  2/2 GI losses, K 3.4 today --Follow up on am BMP --Will replete as needed  FEN/GI:   -Regular diet, NS 125 cc/hr  Prophylaxis:  --SCDs, patient can ambulate   Disposition: home after transition to oral antibiotics, currently awaiting sensitivities  Subjective:  Patient is doing better this morning, and  continue to improve. Patient ask when she could go home. Boyfriend is at bedside and attentive to her needs. They have a better understanding of the plan this morning.  Objective: Temp:  [97.9 F (36.6 C)-99.4 F (37.4 C)] 98 F (36.7 C) (01/19 0456) Pulse Rate:  [88-90] 88 (01/19 0456) Resp:  [17-18] 18 (01/19 0456) BP: (101-103)/(55-64) 103/64 (01/19 0456) SpO2:  [96 %-98 %] 96 % (01/19 0456)   Physical Exam: Gen: Laying in bed, appears mild uncomfortable but in no distress HEENT: NCAT, EOMI, PERRL, MMM CV: RRR, normal S1 and S2, no murmur Resp: CTAB, no wheezes, non-labored Abd: soft, moderate tenderness at Rt CVA and entire Rt aspect of the abdomen, no murphy sign, nondistended, no hepatic or splenomegaly, +BS, mild-to-no guarding, no rebound Ext: No edema, warm and well perfused <3 sec cap refill Neuro: Alert and oriented, Speech clear, No gross deficits   Laboratory:  Recent Labs Lab 06/19/16 0828 06/20/16 0600 06/21/16 0117  WBC 19.1* 12.1* 11.8*  HGB 13.8 11.1* 10.5*  HCT 41.5 34.9* 32.1*  PLT 274 196 224    Recent Labs Lab 06/19/16 0828 06/20/16 0600 06/21/16 0117  NA 135 138 140  K 3.3* 3.8 3.4*  CL 103 109 110  CO2 22 24 23   BUN 5* 6 <5*  CREATININE 0.76 0.53 0.66  CALCIUM 9.2 8.3* 7.9*  PROT 7.5  --   --   BILITOT 1.4*  --   --   ALKPHOS 108  --   --   ALT 27  --   --   AST 29  --   --   GLUCOSE 107* 87 104*    Imaging/Diagnostic Tests: No results found.  Dg Chest 2 View  Result Date: 06/20/2016 CLINICAL DATA:  Cough, sepsis, UTI EXAM: CHEST  2 VIEW COMPARISON:  None. FINDINGS: Cardiomediastinal silhouette is unremarkable. No infiltrate or pleural effusion. No pulmonary edema. Mild infrahilar bronchitic changes. Best seen on lateral view. Bony thorax is unremarkable. IMPRESSION: No infiltrate or pleural effusion. No pulmonary edema. Mild infrahilar bronchitic changes. Electronically Signed   By: Natasha Mead M.D.   On: 06/20/2016 09:40   Ct  Abdomen Pelvis W Contrast  Result Date: 06/20/2016 CLINICAL DATA:  21 year old female with history of recurrent urinary tract infections presenting with 5 day history of dysuria and CVA tenderness compatible with pyelonephritis. Positive urinalysis. Evaluate for potential renal abscess. EXAM: CT ABDOMEN AND PELVIS WITH CONTRAST TECHNIQUE: Multidetector CT imaging of the abdomen and pelvis was performed using the standard protocol following bolus administration of intravenous contrast. CONTRAST:  ISOVUE-300 IOPAMIDOL (ISOVUE-300) INJECTION 61% COMPARISON:  No priors. FINDINGS: Lower chest: Airspace consolidation in the posterior aspect of the right lower lobe. Small right pleural effusion lying dependently. Hepatobiliary: No cystic or solid hepatic lesions. No intra or extrahepatic biliary ductal dilatation. Gallbladder is unremarkable in appearance. Pancreas: No pancreatic mass. No pancreatic ductal dilatation. No pancreatic or peripancreatic fluid or inflammatory changes. Spleen: Unremarkable. Adrenals/Urinary Tract: Striated nephrogram in the right kidney compatible with clinically suspected pyelonephritis. No discrete renal abscess is identified. Small amount of perinephric stranding adjacent to the right kidney. Left kidney is normal in appearance except for a 4 mm nonobstructive calculus in the lower pole collecting system of the left kidney. No hydroureteronephrosis. Urinary bladder is normal in appearance. Stomach/Bowel: The appearance of the stomach is normal. There is no pathologic dilatation of small bowel or colon. Normal appendix. Vascular/Lymphatic: No significant atherosclerotic disease, aneurysm or dissection identified in the abdominal or pelvic vasculature. Numerous prominent but nonenlarged retroperitoneal lymph nodes are noted, presumably reactive. Reproductive: Uterus and ovaries are unremarkable in appearance. Other: Small volume of ascites.  No pneumoperitoneum. Musculoskeletal: There  are no aggressive appearing lytic or blastic lesions noted in the visualized portions of the skeleton. IMPRESSION: 1. Findings are compatible with right-sided pyelonephritis as clinically suspected. No evidence of renal abscess. 2. However, there is also consolidation in the posterior aspect of the right lower lobe. If there has been recent vomiting, findings would suggest sequela of aspiration with aspiration pneumonia/pneumonitis. Small right parapneumonic pleural effusion also noted. 3. 4 mm nonobstructive calculus in the lower pole collecting system of left kidney. Electronically Signed   By: Trudie Reed M.D.   On: 06/20/2016 11:47   US Abdomen Limited  Result Date: 06/19/2016 CLINICAL DATA:  Right upper quadrant pain EXAM: US ABDOMEN LIMITED - RIGHT UPPER QUADRANT COMPARISON:  None. FINDINGS: Gallbladder: No gallstones or wall thickening visualized. No sonographic Murphy sign noted by sonographer. Common bile duct: Diameter: 2.6 Liver: No focal lesion identified. Within normal limits in parenchymal echogenicity. IMPRESSION: 1. Normal exam. Electronically Signed   By: Signa Kell M.D.   On: 06/19/2016 09:25    Lovena Neighbours, MD 06/22/2016, 5:32 AM PGY-1, [facility] [facility] Intern pager: [phone], text pages welcome

## 2016-06-23 NOTE — Discharge Summary (Signed)
Family Medicine Teaching [facility] Discharge Summary  Patient name: Laura Ball Medical record number: [id number] Date of birth: May 13, 1996 Age: 21 y.o. Gender: female Date of Admission: 06/19/2016 Date of Discharge:06/22/2016 Admitting Physician: Moses Manners, MD  Primary Care Provider: No PCP Per Patient Consultants: None   Indication for Hospitalization: Pyelonephritis  Discharge Diagnoses/Problem List:  Pyelonephritis Vaginal Candidiasis  Disposition: Home  Discharge Condition: Stable and improved  Discharge Exam:  Gen: Laying in bed, in NAD able to participate in exam HEENT: NCAT, EOMI, PERRL, MMM CV: RRR, normal S1 and S2, no murmur Resp: CTAB, no wheezes, non-labored, mild CVA tenderness on palpation Abd: soft, mild tenderness on palpation RLQ and suprapubic region, no murphy sign, nondistended, no hepatic or splenomegaly, +BS, mild-to-no guarding, no rebound Ext: No edema, warm and well perfused <3 sec cap refill Neuro: Alert and oriented, Speech clear, No gross deficits  Brief Hospital Course:  Laura Ball a 21 y.o.femalewith no significant past medical history whopresentswith bilaterally back pain and bilateral lower quadrant abdominal pain and found to have pyelonephritis. Patient presented with symptoms of dysuria and CVA tenderness with fever, leukocytosis,vaginal itching and vaginal discharge. Initial GU exam in the ED was negative for adnexal tenderness and PID and torsion were ruled out. Wet prep was positive for vaginal candidiasis but negative for trichomonas and UA was consistent with UTI. Patient was also hypotensive and received multiple NS bolus. Patient also complained of CVA tenderness and lower quadrant abdominal pain. Pain was controlled with morphine initially and late transitioned to dilaudid. Patient was started on ceftriaxone after blood and urine cultures were collected. Patient initially responded well to treatment, with  improving BP, decrease in WBC and improvement in pain. However, patient continue to experience some pain and remain intermittently febrile. CT abdomen and pelvis was ordered to rule abscess with slow improvement noted while on IV ceftriaxone. Imaging results showed no abscess but also reveal RLL consolidation concerning for aspiration pneumonitis in the setting of recent emesis. Patient was switched from ceftriaxone to Zosyn. Urine cultures resulted with >100,000 colonies of E.coli and blood cultures showed no growth. Patient remained afebrile and leukocytosis resolved. After obtaining sensitivities, patient was transitioned to ciprofloxacin. Patient's pain had resolved with stable vitals signs and continued to improve. Patient was stable on discharge, and will complete a 7 days course of ciprofloxacin orally after being on IV antibiotics for 3 days for a total of 10 days. Patient will follow up with primary care physician and might benefit from further evaluation from recurrent episodes (4 ) of UTI in the past year.  Issues for Follow Up:  1. Complete ciprofloxacin  Course 500 mg bid for 7 days. Start 1/19-End 1/25 2. Discuss prophylaxis given 4 UTI/pyelo in the past year. Patient might benefit from outpatient urology referral to rule out anatomical defects as a contributing factor.  Significant Procedures: None   Significant Labs and Imaging:   Recent Labs Lab 06/20/16 0600 06/21/16 0117 06/22/16 0441  WBC 12.1* 11.8* 6.7  HGB 11.1* 10.5* 10.1*  HCT 34.9* 32.1* 31.1*  PLT 196 224 286    Recent Labs Lab 06/19/16 0828 06/20/16 0600 06/21/16 0117 06/22/16 0441  NA 135 138 140 140  K 3.3* 3.8 3.4* 3.4*  CL 103 109 110 107  CO2 22 24 23 26   GLUCOSE 107* 87 104* 86  BUN 5* 6 <5* <5*  CREATININE 0.76 0.53 0.66 0.58  CALCIUM 9.2 8.3* 7.9* 8.3*  ALKPHOS 108  --   --   --  AST 29  --   --   --   ALT 27  --   --   --   ALBUMIN 3.5  --   --   --     Dg Chest 2 View  Result Date:  06/20/2016 CLINICAL DATA:  Cough, sepsis, UTI EXAM: CHEST  2 VIEW COMPARISON:  None. FINDINGS: Cardiomediastinal silhouette is unremarkable. No infiltrate or pleural effusion. No pulmonary edema. Mild infrahilar bronchitic changes. Best seen on lateral view. Bony thorax is unremarkable. IMPRESSION: No infiltrate or pleural effusion. No pulmonary edema. Mild infrahilar bronchitic changes. Electronically Signed   By: Natasha Mead M.D.   On: 06/20/2016 09:40   Ct Abdomen Pelvis W Contrast  Result Date: 06/20/2016 CLINICAL DATA:  21 year old female with history of recurrent urinary tract infections presenting with 5 day history of dysuria and CVA tenderness compatible with pyelonephritis. Positive urinalysis. Evaluate for potential renal abscess. EXAM: CT ABDOMEN AND PELVIS WITH CONTRAST TECHNIQUE: Multidetector CT imaging of the abdomen and pelvis was performed using the standard protocol following bolus administration of intravenous contrast. CONTRAST:  ISOVUE-300 IOPAMIDOL (ISOVUE-300) INJECTION 61% COMPARISON:  No priors. FINDINGS: Lower chest: Airspace consolidation in the posterior aspect of the right lower lobe. Small right pleural effusion lying dependently. Hepatobiliary: No cystic or solid hepatic lesions. No intra or extrahepatic biliary ductal dilatation. Gallbladder is unremarkable in appearance. Pancreas: No pancreatic mass. No pancreatic ductal dilatation. No pancreatic or peripancreatic fluid or inflammatory changes. Spleen: Unremarkable. Adrenals/Urinary Tract: Striated nephrogram in the right kidney compatible with clinically suspected pyelonephritis. No discrete renal abscess is identified. Small amount of perinephric stranding adjacent to the right kidney. Left kidney is normal in appearance except for a 4 mm nonobstructive calculus in the lower pole collecting system of the left kidney. No hydroureteronephrosis. Urinary bladder is normal in appearance. Stomach/Bowel: The appearance of the  stomach is normal. There is no pathologic dilatation of small bowel or colon. Normal appendix. Vascular/Lymphatic: No significant atherosclerotic disease, aneurysm or dissection identified in the abdominal or pelvic vasculature. Numerous prominent but nonenlarged retroperitoneal lymph nodes are noted, presumably reactive. Reproductive: Uterus and ovaries are unremarkable in appearance. Other: Small volume of ascites.  No pneumoperitoneum. Musculoskeletal: There are no aggressive appearing lytic or blastic lesions noted in the visualized portions of the skeleton. IMPRESSION: 1. Findings are compatible with right-sided pyelonephritis as clinically suspected. No evidence of renal abscess. 2. However, there is also consolidation in the posterior aspect of the right lower lobe. If there has been recent vomiting, findings would suggest sequela of aspiration with aspiration pneumonia/pneumonitis. Small right parapneumonic pleural effusion also noted. 3. 4 mm nonobstructive calculus in the lower pole collecting system of left kidney. Electronically Signed   By: Trudie Reed M.D.   On: 06/20/2016 11:47   US Abdomen Limited  Result Date: 06/19/2016 CLINICAL DATA:  Right upper quadrant pain EXAM: US ABDOMEN LIMITED - RIGHT UPPER QUADRANT COMPARISON:  None. FINDINGS: Gallbladder: No gallstones or wall thickening visualized. No sonographic Murphy sign noted by sonographer. Common bile duct: Diameter: 2.6 Liver: No focal lesion identified. Within normal limits in parenchymal echogenicity. IMPRESSION: 1. Normal exam. Electronically Signed   By: Signa Kell M.D.   On: 06/19/2016 09:25    Results/Tests Pending at Time of Discharge: None   Discharge Medications:  Allergies as of 06/22/2016   No Known Allergies     Medication List    TAKE these medications   ciprofloxacin 500 MG tablet Commonly known as:  CIPRO Take  1 tablet (500 mg total) by mouth 2 (two) times daily.   ibuprofen 200 MG tablet Commonly  known as:  ADVIL,MOTRIN Take 200 mg by mouth every 6 (six) hours as needed for mild pain.       Discharge Instructions: Please refer to Patient Instructions section of EMR for full details.  Patient was counseled important signs and symptoms that should prompt return to medical care, changes in medications, dietary instructions, activity restrictions, and follow up appointments.   Follow-Up Appointments: Follow-up Information    [facility]. Call.   Why:  Please call and arrange post hospital follow up appointment once discharged Contact information: [address] [place] [phone]          Lovena NeighboursAbdoulaye Kataleah Bejar, MD 06/23/2016, 2:20 AM PGY-1, [facility]

## 2016-06-24 LAB — CULTURE, BLOOD (ROUTINE X 2)
Culture: NO GROWTH
Culture: NO GROWTH

## 2016-06-25 LAB — CULTURE, BLOOD (ROUTINE X 2)
Culture: NO GROWTH
Culture: NO GROWTH

## 2016-06-27 ENCOUNTER — Inpatient Hospital Stay: Payer: Self-pay | Admitting: Family Medicine

## 2016-07-16 ENCOUNTER — Encounter: Payer: Self-pay | Admitting: Family Medicine

## 2016-07-16 ENCOUNTER — Ambulatory Visit: Payer: Self-pay | Attending: Family Medicine | Admitting: Family Medicine

## 2016-07-16 VITALS — BP 105/67 | HR 76 | Temp 98.0°F | Ht 61.75 in | Wt 138.0 lb

## 2016-07-16 DIAGNOSIS — N12 Tubulo-interstitial nephritis, not specified as acute or chronic: Secondary | ICD-10-CM | POA: Insufficient documentation

## 2016-07-16 DIAGNOSIS — Z5189 Encounter for other specified aftercare: Secondary | ICD-10-CM | POA: Insufficient documentation

## 2016-07-16 LAB — POCT URINALYSIS DIPSTICK
BILIRUBIN UA: NEGATIVE
GLUCOSE UA: NEGATIVE
KETONES UA: NEGATIVE
Nitrite, UA: NEGATIVE
Protein, UA: NEGATIVE
SPEC GRAV UA: 1.02
Urobilinogen, UA: 0.2
pH, UA: 6

## 2016-07-16 NOTE — Progress Notes (Signed)
Her family [place] in [place].  She states she is here alone.

## 2016-07-16 NOTE — Progress Notes (Signed)
Subjective:  Patient ID: Laura Ball, female    DOB: Mar 29, 1996  Age: 21 y.o. MRN: [id number]  CC: Hospitalization Follow-up (polynephritis)   HPI Laura Ball is a 21 year old female who was hospitalized at [facility] from 06/19/16 through 06/22/16 for pyelonephritis.  She had presented with dysuria, fever and CPA tenderness and UA was in keeping with UTI, urine culture positive for Escherichia coli, CBC revealed leukocytosis. CT scan was in keeping with right-sided pyelonephritis, small right parapneumonic pleural effusion, 4 mm nonobstructive calculus in the lower pole of the left kidney. She was placed on IV fluids due to hypotension and commenced on IV antibiotics and opioid analgesics. Of note ED chart reveals she has had 4 episodes of UTI in the past year.  Her condition improved and she was discharged on Cipro for 7 days. Today she complains of persisting right flank pain, some dysuria and lower abdominal pain but denies fever, nausea or vomiting. She has since completed her course of ciprofloxacin.  Past Medical History:  Diagnosis Date  . Chronic lower back pain   . Daily headache    06/19/2016  . Depression   . Frequent UTI   . Pyelonephritis 06/19/2016    Past Surgical History:  Procedure Laterality Date  . NO PAST SURGERIES      No Known Allergies   Outpatient Medications Prior to Visit  Medication Sig Dispense Refill  . ibuprofen (ADVIL,MOTRIN) 200 MG tablet Take 200 mg by mouth every 6 (six) hours as needed for mild pain.     No facility-administered medications prior to visit.     ROS Review of Systems  Constitutional: Negative for activity change, appetite change and fatigue.  HENT: Negative for congestion, sinus pressure and sore throat.   Eyes: Negative for visual disturbance.  Respiratory: Negative for cough, chest tightness, shortness of breath and wheezing.   Cardiovascular: Negative for chest pain and palpitations.    Gastrointestinal: Negative for abdominal distention, abdominal pain and constipation.  Endocrine: Negative for polydipsia.  Genitourinary: Positive for flank pain. Negative for dysuria and frequency.  Musculoskeletal: Negative for arthralgias and back pain.  Skin: Negative for rash.  Neurological: Negative for tremors, light-headedness and numbness.  Hematological: Does not bruise/bleed easily.  Psychiatric/Behavioral: Negative for agitation and behavioral problems.    Objective:  BP 105/67 (BP Location: Right Arm, Patient Position: Sitting, Cuff Size: Small)   Pulse 76   Temp 98 F (36.7 C) (Oral)   Ht 5' 1.75" (1.568 m)   Wt 138 lb (62.6 kg)   LMP  (LMP Unknown)   SpO2 99%   BMI 25.45 kg/m   BP/Weight 07/16/2016 06/22/2016 06/19/2016  Systolic BP 105 103 -  Diastolic BP 67 64 -  Wt. (Lbs) 138 - 145  BMI 25.45 - -      Physical Exam  Constitutional: She is oriented to person, place, and time. She appears well-developed and well-nourished.  Cardiovascular: Normal rate, normal heart sounds and intact distal pulses.   No murmur heard. Pulmonary/Chest: Effort normal and breath sounds normal. She has no wheezes. She has no rales. She exhibits no tenderness.  Abdominal: Soft. Bowel sounds are normal. She exhibits no distension and no mass. There is tenderness (Mild supraumbilical tenderness).  Musculoskeletal: Normal range of motion. She exhibits tenderness (positive CVA tenderness on the right).  Neurological: She is alert and oriented to person, place, and time.     Urinalysis    Component Value Date/Time   COLORURINE AMBER (A) 06/19/2016 0825  APPEARANCEUR HAZY (A) 06/19/2016 0825   LABSPEC 1.020 06/19/2016 0825   PHURINE 7.0 06/19/2016 0825   GLUCOSEU NEGATIVE 06/19/2016 0825   HGBUR MODERATE (A) 06/19/2016 0825   BILIRUBINUR neg 07/16/2016 1154   KETONESUR 80 (A) 06/19/2016 0825   PROTEINUR neg 07/16/2016 1154   PROTEINUR 30 (A) 06/19/2016 0825   UROBILINOGEN  0.2 07/16/2016 1154   NITRITE neg 07/16/2016 1154   NITRITE POSITIVE (A) 06/19/2016 0825   LEUKOCYTESUR large (3+) (A) 07/16/2016 1154     Assessment & Plan:   1. Pyelonephritis Urinalysis is positive for UTI We'll send off culture and treat depending on culture and sensitivity as she just completed a course of antibiotic last month. - Urinalysis Dipstick - Urine culture   No orders of the defined types were placed in this encounter.   Follow-up: Return in about 1 month (around 08/13/2016), or if symptoms worsen or fail to improve, for For follow-up of pyelonephritis.   Jaclyn ShaggyEnobong Amao MD

## 2016-07-18 ENCOUNTER — Other Ambulatory Visit: Payer: Self-pay | Admitting: Family Medicine

## 2016-07-18 LAB — URINE CULTURE: Organism ID, Bacteria: NO GROWTH

## 2016-07-18 MED ORDER — NITROFURANTOIN MONOHYD MACRO 100 MG PO CAPS: 100.0000 mg | ORAL_CAPSULE | Freq: Two times a day (BID) | ORAL | 0 refills | Status: AC

## 2016-07-24 ENCOUNTER — Encounter: Payer: Self-pay | Admitting: *Deleted

## 2017-10-20 IMAGING — CT CT ABD-PELV W/ CM
2 of 5 series · 15 of 46 positions shown, 17 images · IV contrast (iopamidol)
Comparison: No priors.

CLINICAL DATA: 21-year-old female with history of recurrent urinary
tract infections presenting with 5 day history of dysuria and CVA
tenderness compatible with pyelonephritis. Positive urinalysis.
Evaluate for potential renal abscess.

EXAM:
CT ABDOMEN AND PELVIS WITH CONTRAST
TECHNIQUE: Multidetector CT imaging of the abdomen and pelvis was performed
using the standard protocol following bolus administration of
intravenous contrast.
CONTRAST:  100mL 17SP91-DLL IOPAMIDOL (17SP91-DLL) INJECTION 61%

[Series 2: a/p w/ 5mm · axial · 0.75mm/px · z∈[+644,+1079]mm · 12 of 97 slices shown, 14 images]
[im 5/97  soft-tissue]
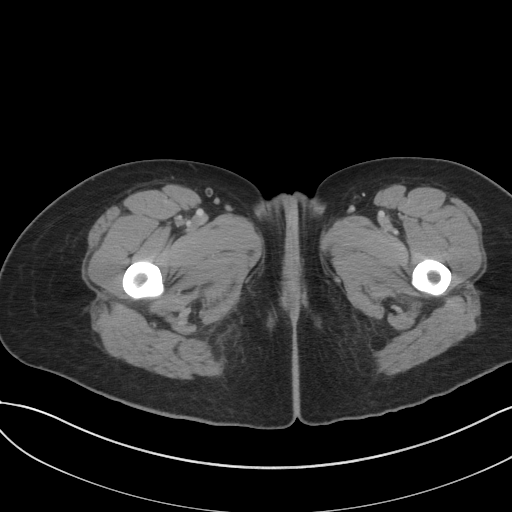
[im 5/97  bone]
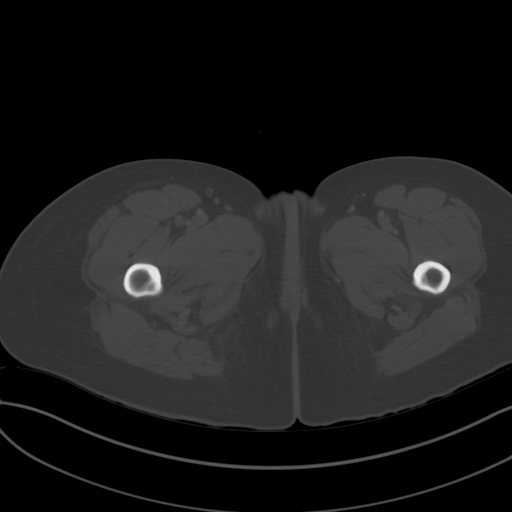
[im 15/97  soft-tissue]
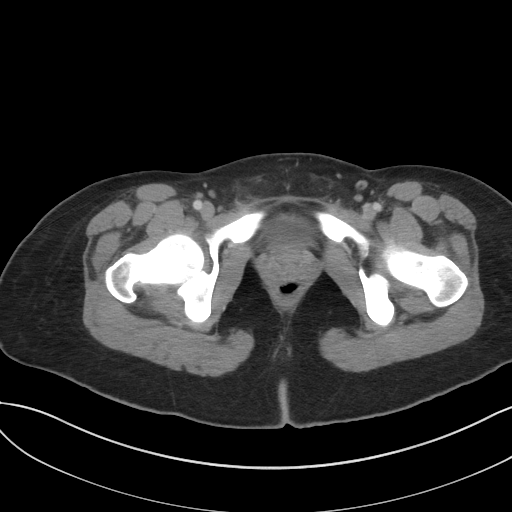
[im 20/97  soft-tissue]
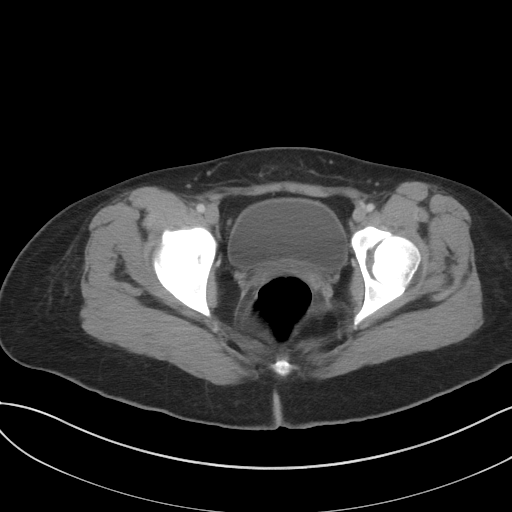
[im 29/97  soft-tissue]
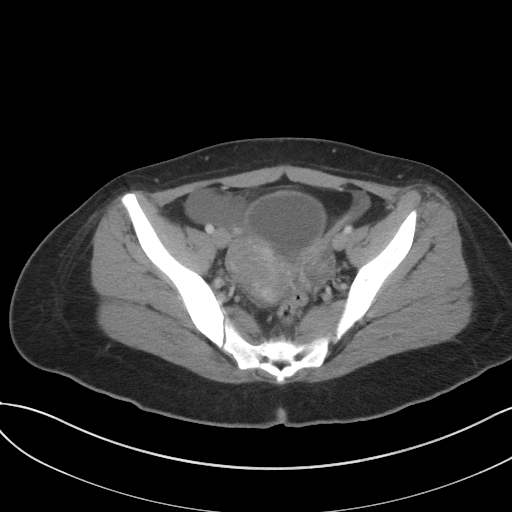
[im 39/97  soft-tissue]
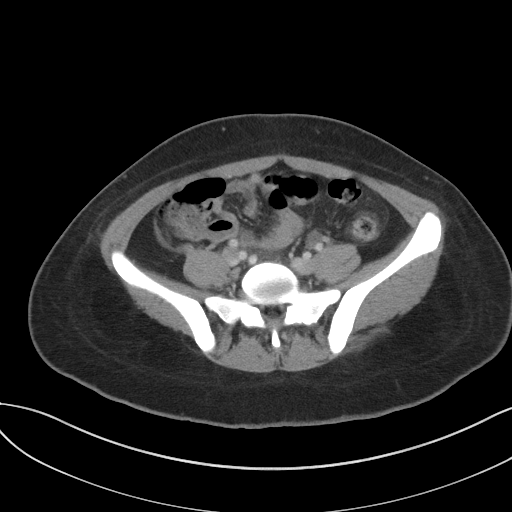
[im 44/97  soft-tissue]
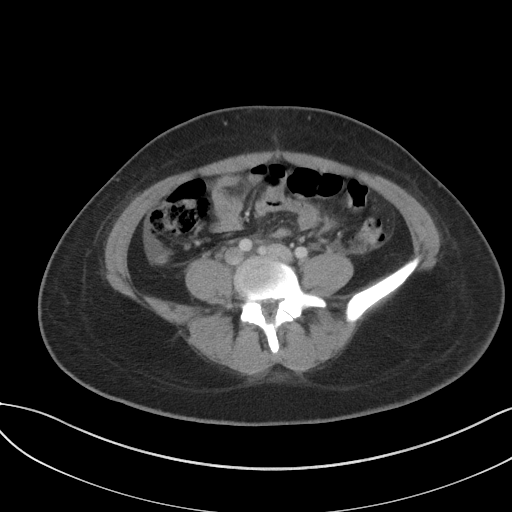
[im 53/97  soft-tissue]
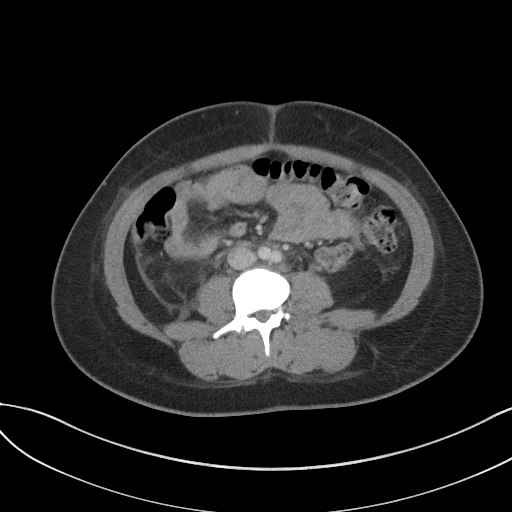
[im 58/97  soft-tissue]
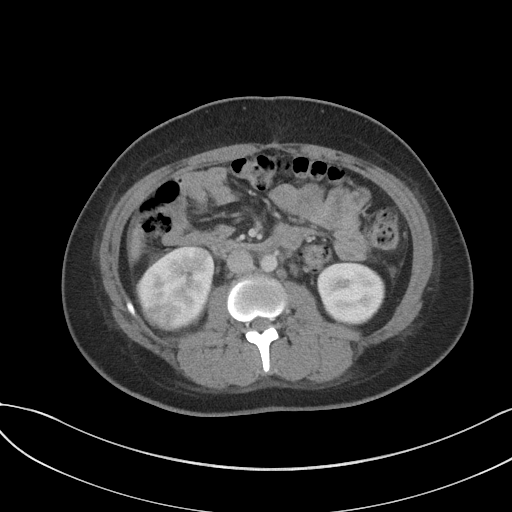
[im 68/97  soft-tissue]
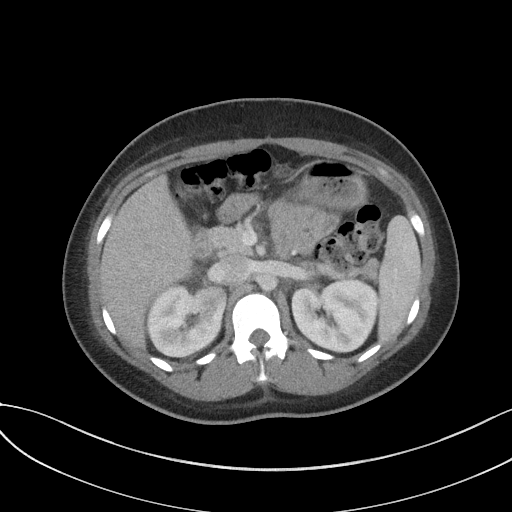
[im 68/97  bone]
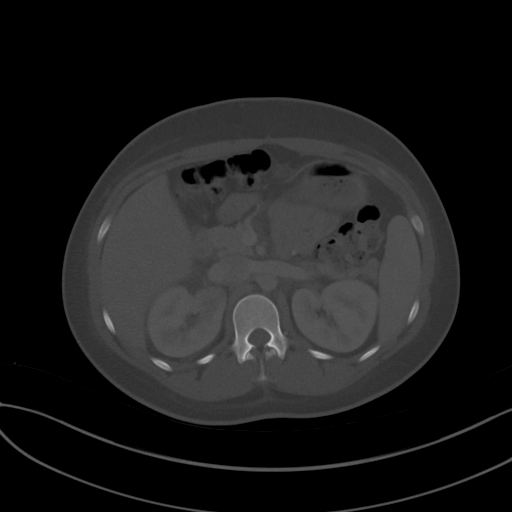
[im 77/97  soft-tissue]
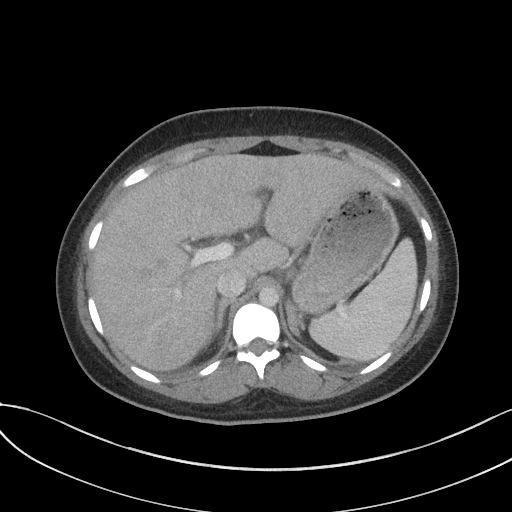
[im 82/97  soft-tissue]
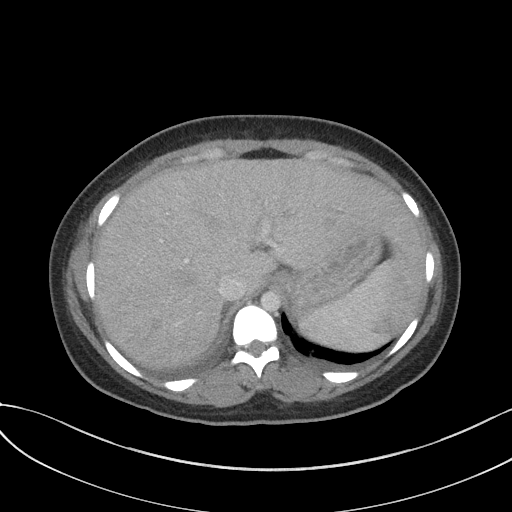
[im 92/97  soft-tissue]
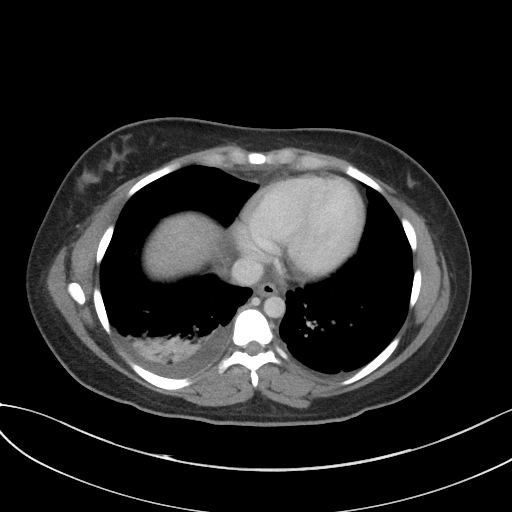

[Series 5: a/p w/ cor · coronal · 0.78mm/px · 3 of 136 slices shown]
[im 46/136  soft-tissue]
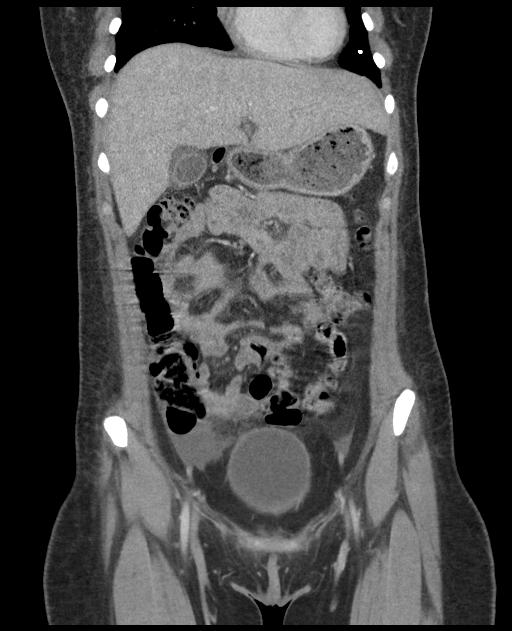
[im 61/136  soft-tissue]
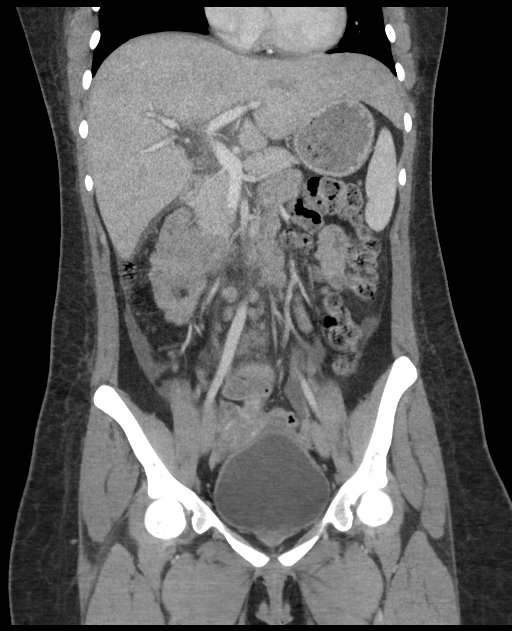
[im 76/136  soft-tissue]
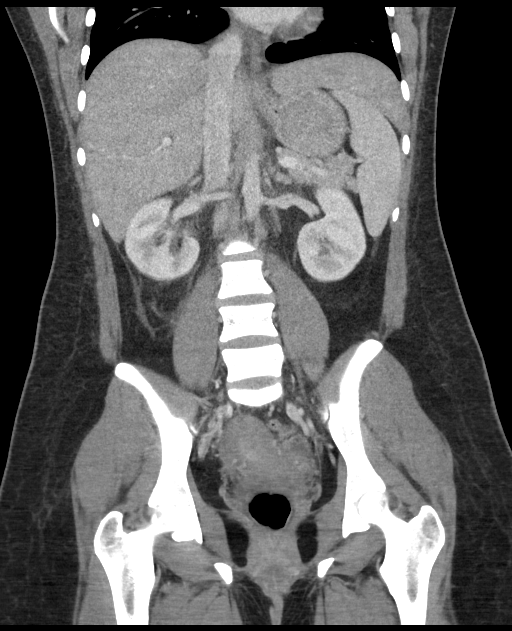

[15 of 46 positions shown; findings below may reference images not displayed]

FINDINGS: Lower chest: Airspace consolidation in the posterior aspect of the
right lower lobe. Small right pleural effusion lying dependently.

Hepatobiliary: No cystic or solid hepatic lesions. No intra or
extrahepatic biliary ductal dilatation. Gallbladder is unremarkable
in appearance.

Pancreas: No pancreatic mass. No pancreatic ductal dilatation. No
pancreatic or peripancreatic fluid or inflammatory changes.

Spleen: Unremarkable.

Adrenals/Urinary Tract: Striated nephrogram in the right kidney
compatible with clinically suspected pyelonephritis. No discrete
renal abscess is identified. Small amount of perinephric stranding
adjacent to the right kidney. Left kidney is normal in appearance
except for a 4 mm nonobstructive calculus in the lower pole
collecting system of the left kidney. No hydroureteronephrosis.
Urinary bladder is normal in appearance.

Stomach/Bowel: The appearance of the stomach is normal. There is no
pathologic dilatation of small bowel or colon. Normal appendix.

Vascular/Lymphatic: No significant atherosclerotic disease, aneurysm
or dissection identified in the abdominal or pelvic vasculature.
Numerous prominent but nonenlarged retroperitoneal lymph nodes are
noted, presumably reactive.

Reproductive: Uterus and ovaries are unremarkable in appearance.

Other: Small volume of ascites.  No pneumoperitoneum.

Musculoskeletal: There are no aggressive appearing lytic or blastic
lesions noted in the visualized portions of the skeleton.
IMPRESSION: 1. Findings are compatible with right-sided pyelonephritis as
clinically suspected. No evidence of renal abscess.
2. However, there is also consolidation in the posterior aspect of
the right lower lobe. If there has been recent vomiting, findings
would suggest sequela of aspiration with aspiration
pneumonia/pneumonitis. Small right parapneumonic pleural effusion
also noted.
3. 4 mm nonobstructive calculus in the lower pole collecting system
of left kidney.
